# Patient Record
Sex: Female | Born: 1985 | Hispanic: No | Marital: Single | Smoking: Current every day smoker
Health system: Southern US, Community
[De-identification: ages and names within clinical notes are randomized; demographics above are authoritative.]

---
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
## 2011-04-20 ENCOUNTER — Ambulatory Visit: Payer: Self-pay

## 2011-04-20 ENCOUNTER — Other Ambulatory Visit: Payer: Self-pay | Admitting: Occupational Medicine

## 2011-04-20 DIAGNOSIS — R52 Pain, unspecified: Secondary | ICD-10-CM

## 2013-03-19 ENCOUNTER — Emergency Department (HOSPITAL_COMMUNITY)
Admission: EM | Admit: 2013-03-19 | Discharge: 2013-03-19 | Disposition: A | Discharge status: Home / Self Care | Payer: Self-pay | Attending: Emergency Medicine | Admitting: Emergency Medicine

## 2013-03-19 ENCOUNTER — Encounter (HOSPITAL_COMMUNITY): Payer: Self-pay | Admitting: *Deleted

## 2013-03-19 DIAGNOSIS — Z79899 Other long term (current) drug therapy: Secondary | ICD-10-CM | POA: Insufficient documentation

## 2013-03-19 DIAGNOSIS — F17200 Nicotine dependence, unspecified, uncomplicated: Secondary | ICD-10-CM | POA: Insufficient documentation

## 2013-03-19 DIAGNOSIS — Y92410 Unspecified street and highway as the place of occurrence of the external cause: Secondary | ICD-10-CM | POA: Insufficient documentation

## 2013-03-19 DIAGNOSIS — M25569 Pain in unspecified knee: Secondary | ICD-10-CM

## 2013-03-19 DIAGNOSIS — Y9389 Activity, other specified: Secondary | ICD-10-CM | POA: Insufficient documentation

## 2013-03-19 DIAGNOSIS — S8990XA Unspecified injury of unspecified lower leg, initial encounter: Secondary | ICD-10-CM | POA: Insufficient documentation

## 2013-03-19 MED ORDER — IBUPROFEN 200 MG PO TABS
600.0000 mg | ORAL_TABLET | Freq: Once | ORAL | Status: AC
  Administered 2013-03-19: 600 mg via ORAL
  Filled 2013-03-19: qty 3
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
## 2013-04-30 ENCOUNTER — Emergency Department (INDEPENDENT_AMBULATORY_CARE_PROVIDER_SITE_OTHER)
Admission: EM | Admit: 2013-04-30 | Discharge: 2013-04-30 | Disposition: A | Discharge status: Home / Self Care | Payer: Self-pay | Source: Home / Self Care

## 2013-04-30 ENCOUNTER — Encounter (HOSPITAL_COMMUNITY): Payer: Self-pay | Admitting: Emergency Medicine

## 2013-04-30 DIAGNOSIS — R21 Rash and other nonspecific skin eruption: Secondary | ICD-10-CM

## 2013-04-30 MED ORDER — PREDNISONE (PAK) 10 MG PO TABS: ORAL_TABLET | Freq: Every day | ORAL | Status: DC

## 2013-04-30 NOTE — ED Provider Notes (Signed)
CSN: [id number]     Arrival date & time 04/30/13  1308 History   None    Chief Complaint  Patient presents with  . Rash   (Consider location/radiation/quality/duration/timing/severity/associated sxs/prior Treatment) HPI Comments: Very pleasant 27 yo black female presents with a mildly pruritic rash x 1 week. No known exposures. No prior history. No fatigue, fever, chills, weight loss or viral symptoms. Located on the trunk only. Using OTC cortisone cream which does help some.  Patient is a 27 y.o. female presenting with rash. The history is provided by the patient.  Rash   History reviewed. No pertinent past medical history. Past Surgical History  Procedure Laterality Date  . Anterior cruciate ligament repair  2007   No family history on file. History  Substance Use Topics  . Smoking status: Current Every Day Smoker -- 1.00 packs/day    Types: Cigarettes  . Smokeless tobacco: Not on file  . Alcohol Use: Not on file   OB History   Grav Para Term Preterm Abortions TAB SAB Ect Mult Living                 Review of Systems  Skin: Positive for rash.  All other systems reviewed and are negative.    Allergies  Eggs or egg-derived products  Home Medications   Current Outpatient Rx  Name  Route  Sig  Dispense  Refill  . naproxen sodium (ANAPROX) 220 MG tablet   Oral   Take 220 mg by mouth 2 (two) times daily as needed (pain).         . predniSONE (STERAPRED UNI-PAK) 10 MG tablet   Oral   Take by mouth daily. 12 day Pred Pack as directed   1 tablet   0    BP 168/81  Pulse 76  Temp(Src) 98.7 F (37.1 C) (Oral)  Resp 18  SpO2 98%  LMP 03/30/2013 Physical Exam  Nursing note and vitals reviewed. Constitutional: She is oriented to person, place, and time. She appears well-developed and well-nourished. No distress.  HENT:  Head: Normocephalic and atraumatic.  Neck: Normal range of motion.  Pulmonary/Chest: Effort normal.  Musculoskeletal: Normal range of  motion.  Neurological: She is alert and oriented to person, place, and time.  Skin: Skin is warm and dry. Rash noted.  Coin shaped scaly lesions noted on the trunk with hyperpigementation  Psychiatric: Her behavior is normal.    ED Course  Procedures (including critical care time) Labs Review Labs Reviewed - No data to display Imaging Review No results found.  EKG Interpretation     Ventricular Rate:    PR Interval:    QRS Duration:   QT Interval:    QTC Calculation:   R Axis:     Text Interpretation:              MDM   1. Rash and nonspecific skin eruption   Appears to be nummular dermatitis or eczema given the coin shaped lesions. Treat acutely with Prednisone 12 day pack. If returns advise Dermatology appt for scraping.     Riki Sheer, PA-C 04/30/13 1051

## 2013-04-30 NOTE — ED Provider Notes (Signed)
Medical screening examination/treatment/procedure(s) were performed by resident physician or non-physician practitioner and as supervising physician I was immediately available for consultation/collaboration.   Barkley Bruns MD.   Linna Hoff, MD 04/30/13 1124

## 2013-04-30 NOTE — ED Notes (Signed)
Onset 1 1/2 week ago.  Patient first noticed rash to abdomen, patient used a cortizone ointment, the abdominal rash resolved, but then spread to other areas of torso.  Areas do itch, sometimes, but not overwhelmingly.

## 2016-04-20 ENCOUNTER — Other Ambulatory Visit (HOSPITAL_COMMUNITY)
Admission: RE | Admit: 2016-04-20 | Discharge: 2016-04-20 | Disposition: A | Discharge status: Home / Self Care | Payer: BLUE CROSS/BLUE SHIELD | Source: Ambulatory Visit | Attending: Family Medicine | Admitting: Family Medicine

## 2016-04-20 ENCOUNTER — Other Ambulatory Visit: Payer: Self-pay | Admitting: Family Medicine

## 2016-04-20 DIAGNOSIS — Z01419 Encounter for gynecological examination (general) (routine) without abnormal findings: Secondary | ICD-10-CM | POA: Diagnosis present

## 2016-04-24 LAB — CYTOLOGY - PAP: DIAGNOSIS: NEGATIVE

## 2017-12-21 DIAGNOSIS — M25561 Pain in right knee: Secondary | ICD-10-CM | POA: Diagnosis not present

## 2018-04-27 ENCOUNTER — Encounter (HOSPITAL_COMMUNITY): Payer: Self-pay | Admitting: Obstetrics and Gynecology

## 2018-04-27 ENCOUNTER — Emergency Department (HOSPITAL_COMMUNITY)
Admission: EM | Admit: 2018-04-27 | Discharge: 2018-04-27 | Disposition: A | Discharge status: Home / Self Care | Payer: No Typology Code available for payment source | Attending: Emergency Medicine | Admitting: Emergency Medicine

## 2018-04-27 ENCOUNTER — Other Ambulatory Visit: Payer: Self-pay

## 2018-04-27 ENCOUNTER — Emergency Department (HOSPITAL_COMMUNITY): Payer: No Typology Code available for payment source

## 2018-04-27 DIAGNOSIS — F17210 Nicotine dependence, cigarettes, uncomplicated: Secondary | ICD-10-CM | POA: Diagnosis not present

## 2018-04-27 DIAGNOSIS — Y998 Other external cause status: Secondary | ICD-10-CM | POA: Diagnosis not present

## 2018-04-27 DIAGNOSIS — Y92410 Unspecified street and highway as the place of occurrence of the external cause: Secondary | ICD-10-CM | POA: Insufficient documentation

## 2018-04-27 DIAGNOSIS — S0990XA Unspecified injury of head, initial encounter: Secondary | ICD-10-CM | POA: Diagnosis present

## 2018-04-27 DIAGNOSIS — Y93I9 Activity, other involving external motion: Secondary | ICD-10-CM | POA: Diagnosis not present

## 2018-04-27 MED ORDER — IBUPROFEN 600 MG PO TABS: 600.0000 mg | ORAL_TABLET | Freq: Four times a day (QID) | ORAL | 0 refills | Status: DC | PRN

## 2018-04-27 MED ORDER — IBUPROFEN 200 MG PO TABS
600.0000 mg | ORAL_TABLET | Freq: Once | ORAL | Status: AC
  Administered 2018-04-27: 600 mg via ORAL
  Filled 2018-04-27: qty 3

## 2018-04-27 MED ORDER — ACETAMINOPHEN 500 MG PO TABS
1000.0000 mg | ORAL_TABLET | Freq: Once | ORAL | Status: AC
  Administered 2018-04-27: 1000 mg via ORAL
  Filled 2018-04-27: qty 2

## 2018-04-27 MED ORDER — ONDANSETRON HCL 4 MG PO TABS: 4.0000 mg | ORAL_TABLET | Freq: Four times a day (QID) | ORAL | 0 refills | Status: AC
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
## 2018-04-27 NOTE — ED Notes (Signed)
RN leaving patient in triage room  At this time as pt reports light sensitivity and headache.

## 2018-04-27 NOTE — Discharge Instructions (Signed)
[place]

## 2019-08-27 ENCOUNTER — Encounter (HOSPITAL_COMMUNITY): Payer: Self-pay

## 2019-08-27 ENCOUNTER — Other Ambulatory Visit: Payer: Self-pay

## 2019-08-27 ENCOUNTER — Emergency Department (HOSPITAL_COMMUNITY)
Admission: EM | Admit: 2019-08-27 | Discharge: 2019-08-27 | Disposition: A | Discharge status: Home / Self Care | Payer: No Typology Code available for payment source | Attending: Emergency Medicine | Admitting: Emergency Medicine

## 2019-08-27 DIAGNOSIS — F17210 Nicotine dependence, cigarettes, uncomplicated: Secondary | ICD-10-CM | POA: Insufficient documentation

## 2019-08-27 DIAGNOSIS — T23041A Burn of unspecified degree of multiple right fingers (nail), including thumb, initial encounter: Secondary | ICD-10-CM | POA: Diagnosis present

## 2019-08-27 DIAGNOSIS — Y9389 Activity, other specified: Secondary | ICD-10-CM | POA: Diagnosis not present

## 2019-08-27 DIAGNOSIS — T23241A Burn of second degree of multiple right fingers (nail), including thumb, initial encounter: Secondary | ICD-10-CM | POA: Diagnosis not present

## 2019-08-27 DIAGNOSIS — Y990 Civilian activity done for income or pay: Secondary | ICD-10-CM | POA: Insufficient documentation

## 2019-08-27 DIAGNOSIS — Y929 Unspecified place or not applicable: Secondary | ICD-10-CM | POA: Diagnosis not present

## 2019-08-27 DIAGNOSIS — Z79899 Other long term (current) drug therapy: Secondary | ICD-10-CM | POA: Insufficient documentation

## 2019-08-27 DIAGNOSIS — X16XXXA Contact with hot heating appliances, radiators and pipes, initial encounter: Secondary | ICD-10-CM | POA: Insufficient documentation

## 2019-08-27 MED ORDER — SILVER SULFADIAZINE 1 % EX CREA
TOPICAL_CREAM | Freq: Once | CUTANEOUS | Status: AC
  Filled 2019-08-27: qty 50

## 2019-08-27 MED ORDER — SILVER SULFADIAZINE 1 % EX CREA: 1.0000 "application " | TOPICAL_CREAM | Freq: Two times a day (BID) | CUTANEOUS | 0 refills | Status: AC

## 2019-08-27 NOTE — ED Provider Notes (Signed)
[facility] DEPT Provider Note   CSN: [id number] Arrival date & time: 08/27/19  2012     History Chief Complaint  Patient presents with  . Hand Burn    Monique Vasquez is a 34 y.o. female.  The history is provided by the patient. No language interpreter was used.   Monique Vasquez is a 34 y.o. female who presents to the Emergency Department complaining of hand burn. She is right-hand dominant and presents to the emergency department for evaluation of right hand burned while she was at work. She works at [employer] and was reaching for a metal spoon it was hot and burned her hand. She complains of pain and numbness to the tip of her right thumb as well as the palm of her hand and across a few digits. She has no medical problems and takes no medications. She states her tetanus shot was in the last 10 years. She denies any chance of pregnancy. Symptoms are moderate and constant nature.    History reviewed. No pertinent past medical history.  There are no problems to display for this patient.   Past Surgical History:  Procedure Laterality Date  . ANTERIOR CRUCIATE LIGAMENT REPAIR  2007     OB History   No obstetric history on file.     No family history on file.  Social History   Tobacco Use  . Smoking status: Current Every Day Smoker    Packs/day: 1.00    Types: Cigarettes  . Smokeless tobacco: Never Used  Substance Use Topics  . Alcohol use: Yes    Comment: Socially  . Drug use: Yes    Types: Marijuana    Home Medications Prior to Admission medications   Medication Sig Start Date End Date Taking? Authorizing Provider  ibuprofen (ADVIL,MOTRIN) 600 MG tablet Take 1 tablet (600 mg total) by mouth every 6 (six) hours as needed. 04/27/18   Valarie Merino, MD  Menthol (ICY HOT BACK) 5 % PTCH Apply 1 patch topically daily as needed.    [provider]  naproxen sodium (ANAPROX) 220 MG tablet Take 660 mg by mouth daily as needed (pain).      [provider]  ondansetron (ZOFRAN) 4 MG tablet Take 1 tablet (4 mg total) by mouth every 6 (six) hours. 04/27/18   Valarie Merino, MD  predniSONE (STERAPRED UNI-PAK) 10 MG tablet Take by mouth daily. 12 day Pred Pack as directed Patient not taking: Reported on 04/27/2018 04/30/13   Bjorn Pippin, PA-C  silver sulfADIAZINE (SILVADENE) 1 % cream Apply 1 application topically 2 (two) times daily. Apply to affected area 08/27/19   Quintella Reichert, MD    Allergies    Eggs or egg-derived products  Review of Systems   Review of Systems  All other systems reviewed and are negative.   Physical Exam Updated Vital Signs BP (!) 129/92 (BP Location: Left Arm)   Pulse 71   Temp 98.5 F (36.9 C) (Oral)   Resp 17   Ht 5\' 4"  (1.626 m)   Wt 100.7 kg   SpO2 99%   BMI 38.11 kg/m   Physical Exam Vitals and nursing note reviewed.  Constitutional:      Appearance: She is well-developed.  HENT:     Head: Normocephalic and atraumatic.  Cardiovascular:     Rate and Rhythm: Normal rate.  Pulmonary:     Effort: Pulmonary effort is normal. No respiratory distress.  Musculoskeletal:  General: No tenderness.     Comments: 2+ radial pulses bilaterally. There is a linear burn to the palmar surface of the right hand that is partial thickness. There is also a small partial thickness burns on the palmar surface of the right second digit. There is a partial to full thickness burns about the size of a dime to the tip of the right thumb. There is no significant edema of the digits. Range of motion is intact throughout the hand. Flexion extension is intact throughout all digits against resistance. No circumferential burn  Skin:    General: Skin is warm and dry.  Neurological:     Mental Status: She is alert and oriented to person, place, and time.     Comments: 5/5 grip strength bilaterally.  AIN, PIN intact  Psychiatric:        Behavior: Behavior normal.     ED Results /  Procedures / Treatments   Labs (all labs ordered are listed, but only abnormal results are displayed) Labs Reviewed - No data to display  EKG None  Radiology No results found.  Procedures Procedures (including critical care time)  Medications Ordered in ED Medications  silver sulfADIAZINE (SILVADENE) 1 % cream (has no administration in time range)    ED Course  I have reviewed the triage vital signs and the nursing notes.  Pertinent labs & imaging results that were available during my care of the patient were reviewed by me and considered in my medical decision making (see chart for details).    MDM Rules/Calculators/A&P                     patient here for evaluation of right hand pain following a burn. She has partial thickness burns to the palmar hand with skin intact. There is a possible very small area of full thickness burns to the tip of the left thumb. She is well perfused on examination with no circumferential burns. Discussed with patient home care for hand burn. Discussed recommendation for elevation cool compresses, NSAIDs. Discussed hand follow-up and return precautions.  Final Clinical Impression(s) / ED Diagnoses Final diagnoses:  Partial thickness burn of multiple digits of right hand including partial thickness burn of thumb, initial encounter    Rx / DC Orders ED Discharge Orders         Ordered    silver sulfADIAZINE (SILVADENE) 1 % cream  2 times daily     08/27/19 2107           Tilden Fossa, MD 08/27/19 2112
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
## 2019-08-27 NOTE — Discharge Instructions (Signed)
Be sure to keep your hand elevated above your heart. You may apply ice packs for comfort. You may take ibuprofen, available over-the-counter according to label instructions for pain.
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
## 2019-09-06 ENCOUNTER — Encounter: Payer: Self-pay | Admitting: Internal Medicine

## 2019-09-06 ENCOUNTER — Ambulatory Visit (INDEPENDENT_AMBULATORY_CARE_PROVIDER_SITE_OTHER): Payer: No Typology Code available for payment source | Admitting: Internal Medicine

## 2019-09-06 ENCOUNTER — Other Ambulatory Visit: Payer: Self-pay

## 2019-09-06 VITALS — BP 129/89 | HR 73 | Temp 98.1°F | Ht 64.0 in | Wt 220.0 lb

## 2019-09-06 DIAGNOSIS — T23241A Burn of second degree of multiple right fingers (nail), including thumb, initial encounter: Secondary | ICD-10-CM

## 2019-09-06 NOTE — Patient Instructions (Addendum)
Ms. Elms,  It was a pleasure meeting you today.  You can continue to use Silvadene on the wound daily with dressing changes.  Afterwards you can use xeroform or vaseline daily with dressing changes.  Please follow up in 2 weeks.  Please call the clinic with any questions or concerns in the meantime.

## 2019-09-06 NOTE — Progress Notes (Signed)
   Subjective:     Patient ID: Monique Vasquez, female    DOB: February 26, 1986, 34 y.o.   MRN: [id number]  Chief Complaint  Patient presents with  . Advice Only    for burn on hand    HPI: The patient is a 34 y.o. female here for right hand burn  The patient states that 10 days ago she burned her hand at work when picking up a hot spoon.  She visited the ED and was started on Silvadene.  She reports using it every day with dressing changes.  She denies any drainage to the wound.  She states she did not have any superficial blisters following the incident.  She reports discomfort when using her thumb to pick up objects.    Review of Systems  All other systems reviewed and are negative.    has no past medical history on file.  has a past surgical history that includes Anterior cruciate ligament repair (2007).  reports that she has been smoking cigarettes. She has been smoking about 1.00 pack per day. She has never used smokeless tobacco. Objective:   Vital Signs BP 129/89 (BP Location: Left Arm, Patient Position: Sitting, Cuff Size: Large)   Pulse 73   Temp 98.1 F (36.7 C) (Temporal)   Ht 5\' 4"  (1.626 m)   Wt 220 lb (99.8 kg)   SpO2 100%   BMI 37.76 kg/m  Vital Signs and Nursing Note Reviewed Physical Exam  Skin: Skin is warm and dry.  Burn thumb wound measures: 1.5 x 1.0 cm     Assessment/Plan:     ICD-10-CM   1. Partial thickness burn of multiple digits of right hand including partial thickness burn of thumb, initial encounter  T23.241A     Assessment: Partial-thickness burn of the right hand There are no open wounds to the right hand. The palm and index finger has a dried scab. The right thumb has a hardened area at the distal point.  There is likely a blister underneath that will eventually rupture.  At this time I recommended using Vaseline or xerofrom on the hand with daily dressing changes.  She has full range of motion of her hand and fingers.  She states she still has  discomfort with her thumb when picking up items.  I will have her follow-up in 2 weeks to reassess the thumb.  Plan - xeroform or vaseline with daily dressing changes - In office xeroform dressing change -follow up in 2 weeks  , DO 09/06/2019, 11:49 AM

## 2019-09-19 DIAGNOSIS — Z13228 Encounter for screening for other metabolic disorders: Secondary | ICD-10-CM | POA: Diagnosis not present

## 2019-09-19 DIAGNOSIS — Z13220 Encounter for screening for lipoid disorders: Secondary | ICD-10-CM | POA: Diagnosis not present

## 2019-09-19 DIAGNOSIS — Z113 Encounter for screening for infections with a predominantly sexual mode of transmission: Secondary | ICD-10-CM | POA: Diagnosis not present

## 2019-09-19 DIAGNOSIS — Z01419 Encounter for gynecological examination (general) (routine) without abnormal findings: Secondary | ICD-10-CM | POA: Diagnosis not present

## 2019-09-19 DIAGNOSIS — E559 Vitamin D deficiency, unspecified: Secondary | ICD-10-CM | POA: Diagnosis not present

## 2019-09-19 DIAGNOSIS — N898 Other specified noninflammatory disorders of vagina: Secondary | ICD-10-CM | POA: Diagnosis not present

## 2019-09-19 DIAGNOSIS — Z131 Encounter for screening for diabetes mellitus: Secondary | ICD-10-CM | POA: Diagnosis not present

## 2019-09-20 ENCOUNTER — Encounter: Payer: Self-pay | Admitting: Internal Medicine

## 2019-09-20 ENCOUNTER — Ambulatory Visit (INDEPENDENT_AMBULATORY_CARE_PROVIDER_SITE_OTHER): Payer: No Typology Code available for payment source | Admitting: Internal Medicine

## 2019-09-20 ENCOUNTER — Other Ambulatory Visit: Payer: Self-pay

## 2019-09-20 VITALS — BP 111/79 | HR 98 | Temp 98.2°F | Ht 64.0 in | Wt 220.0 lb

## 2019-09-20 DIAGNOSIS — T23249D Burn of second degree of unspecified multiple fingers (nail), including thumb, subsequent encounter: Secondary | ICD-10-CM | POA: Diagnosis not present

## 2019-09-20 NOTE — Patient Instructions (Signed)
Monique Vasquez,  It was a pleasure seeing you today.  Please use xeroform or vaseline on the thumb.  You are cleared to return to work on 4/13.  Please follow up with [place] as needed  Please call with any questions or concerns

## 2019-09-20 NOTE — Progress Notes (Signed)
   Subjective:     Patient ID: Monique Vasquez, female    DOB: [birth year], 34 y.o.   MRN: [id number]  Chief Complaint  Patient presents with  . Follow-up    follow up 09/06/19 partial thickness of multiple digigs of right hand including thumb    HPI: The patient is a 34 y.o. female here for follow-up right hand burn  She has been using silvadene on the wound daily with coban wrap.  She reports minimal discomfort to the thumb.  Overall feels well with no complaints today.  Review of Systems  All other systems reviewed and are negative.    has no past medical history on file.  has a past surgical history that includes Anterior cruciate ligament repair (2007).  reports that she has been smoking cigarettes. She has been smoking about 1.00 pack per day. She has never used smokeless tobacco. Objective:   Vital Signs BP 111/79 (BP Location: Left Arm, Patient Position: Sitting, Cuff Size: Large)   Pulse 98   Temp 98.2 F (36.8 C) (Temporal)   Ht 5\' 4"  (1.626 m)   Wt 220 lb (99.8 kg)   SpO2 96%   BMI 37.76 kg/m  Vital Signs and Nursing Note Reviewed Physical Exam  Constitutional: She is well-developed, well-nourished, and in no distress.  Cardiovascular: Intact distal pulses.  Musculoskeletal:     Comments: 5/5 grip strength to right hand Sensation intact to the right hand  Skin: Skin is warm and dry.      Assessment/Plan:     ICD-10-CM   1. Partial thickness burn of multiple digits of hand including partial thickness burn of thumb, subsequent encounter  T23.249D     Assessment: Partial-thickness burn of the right hand It has been over 3 weeks since the original burn incident.  Last visit she had a hardened area at the distal point of her right thumb that has softened some.  I encouraged she use vaseline or xeroform on the site with coban wrap to continue to soften the area.  She has sensation and no restriction with the right hand.  Overall she is doing well and can return to  work at .  Plan -vaseline or xerofrom on the thumb with coban wrap -follow up as needed -can return to work   [employer], DO 09/20/2019, 10:17 AM

## 2019-12-21 ENCOUNTER — Telehealth: Payer: Self-pay | Admitting: Internal Medicine

## 2019-12-21 NOTE — Telephone Encounter (Signed)
Rodman Pickle left vm requesting last office note from 09/20/19. Faxed note to [phone]; received success confirmation.

## 2020-01-25 DIAGNOSIS — S83411A Sprain of medial collateral ligament of right knee, initial encounter: Secondary | ICD-10-CM | POA: Diagnosis not present

## 2020-02-15 ENCOUNTER — Telehealth: Payer: Self-pay | Admitting: *Deleted

## 2020-02-15 NOTE — Telephone Encounter (Signed)
Filled out Form 25R and faxed it back on (02/14/20) [id number] [place] .  Confirmation received and copy scanned into the chart.//AB/CMA

## 2020-02-17 DIAGNOSIS — Z20822 Contact with and (suspected) exposure to covid-19: Secondary | ICD-10-CM | POA: Diagnosis not present

## 2020-05-22 DIAGNOSIS — R432 Parageusia: Secondary | ICD-10-CM | POA: Diagnosis not present

## 2020-05-22 DIAGNOSIS — U071 COVID-19: Secondary | ICD-10-CM | POA: Diagnosis not present

## 2020-05-22 DIAGNOSIS — R430 Anosmia: Secondary | ICD-10-CM | POA: Diagnosis not present

## 2021-10-06 ENCOUNTER — Ambulatory Visit (INDEPENDENT_AMBULATORY_CARE_PROVIDER_SITE_OTHER): Payer: BC Managed Care – PPO

## 2021-10-06 ENCOUNTER — Other Ambulatory Visit: Payer: Self-pay

## 2021-10-06 ENCOUNTER — Ambulatory Visit (INDEPENDENT_AMBULATORY_CARE_PROVIDER_SITE_OTHER): Payer: BC Managed Care – PPO | Admitting: Orthopaedic Surgery

## 2021-10-06 DIAGNOSIS — Z9889 Other specified postprocedural states: Secondary | ICD-10-CM

## 2021-10-06 DIAGNOSIS — M1711 Unilateral primary osteoarthritis, right knee: Secondary | ICD-10-CM

## 2021-10-06 DIAGNOSIS — G8929 Other chronic pain: Secondary | ICD-10-CM

## 2021-10-06 DIAGNOSIS — M25561 Pain in right knee: Secondary | ICD-10-CM

## 2021-10-06 NOTE — Progress Notes (Signed)
The patient is a 36 year old female who comes in today with chronic knee pain with locking and catching.  In 2007 she had a right knee ACL reconstruction done elsewhere.  Her knee had done well for many years.  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  It is hurt since then.  She was concerned that there was something torn within her right knee.  She has even seen the previous orthopedic surgeon who recommended therapy which was an appropriate recommendation I believe at the time.  She comes today for second opinion still concerned that there is something going on her knee because she develops locking catching and instability with the right knee. ? ?On exam both knees slightly hyperextend.  I do feel stable endpoints of the right knee but she is uncomfortable when I put her through a Lachman's exam.  Her range of motion is entirely full.  There is a little bit of posterior lateral pain when I put her through McMurray's exam and with rotation of the tibia on the femur.  There is slight laxity in that right knee when comparing the right and left knees but is only slight.  There is no knee joint effusion. ? ?A standing AP and lateral of the right knee shows no acute findings.  The bone tunnels appear in good position with no widening of the tunnels and the knee looks good overall. ? ?At this point I do feel a MRI is warranted of her right knee given that she continues to have symptoms of locking catching and instability symptoms of that right knee now 2 years after her injury.  She is concerned about the integrity of her ACL reconstruction and I feel that there is certainly needs an MRI to assess the ACL as well as look for meniscal tear.  We will see her back once we have that study. ?

## 2021-10-15 ENCOUNTER — Ambulatory Visit
Admission: RE | Admit: 2021-10-15 | Discharge: 2021-10-15 | Disposition: A | Discharge status: Home / Self Care | Payer: BC Managed Care – PPO | Source: Ambulatory Visit | Attending: Orthopaedic Surgery | Admitting: Orthopaedic Surgery

## 2021-10-15 DIAGNOSIS — M25461 Effusion, right knee: Secondary | ICD-10-CM | POA: Diagnosis not present

## 2021-10-15 DIAGNOSIS — R600 Localized edema: Secondary | ICD-10-CM | POA: Diagnosis not present

## 2021-10-15 DIAGNOSIS — M1711 Unilateral primary osteoarthritis, right knee: Secondary | ICD-10-CM | POA: Diagnosis not present

## 2021-10-15 DIAGNOSIS — Z9889 Other specified postprocedural states: Secondary | ICD-10-CM | POA: Diagnosis not present

## 2021-11-04 ENCOUNTER — Ambulatory Visit (INDEPENDENT_AMBULATORY_CARE_PROVIDER_SITE_OTHER): Payer: BC Managed Care – PPO | Admitting: Orthopaedic Surgery

## 2021-11-04 ENCOUNTER — Encounter: Payer: Self-pay | Admitting: Orthopaedic Surgery

## 2021-11-04 DIAGNOSIS — M1711 Unilateral primary osteoarthritis, right knee: Secondary | ICD-10-CM

## 2021-11-04 MED ORDER — CELECOXIB 200 MG PO CAPS: 200.0000 mg | ORAL_CAPSULE | Freq: Every day | ORAL | 1 refills | Status: AC

## 2021-11-04 MED ORDER — LIDOCAINE HCL 1 % IJ SOLN
3.0000 mL | INTRAMUSCULAR | Status: AC | PRN
  Administered 2021-11-04: 3 mL

## 2021-11-04 MED ORDER — METHYLPREDNISOLONE ACETATE 40 MG/ML IJ SUSP
40.0000 mg | INTRAMUSCULAR | Status: AC | PRN
  Administered 2021-11-04: 40 mg via INTRA_ARTICULAR

## 2021-11-04 NOTE — Progress Notes (Signed)
Office Visit Note   Patient: Monique Vasquez           Date of Birth: 04/02/1986           MRN: [id number] Visit Date: 11/04/2021              Requested by: No referring provider defined for this encounter. PCP: Patient, No Pcp Per (Inactive)   Assessment & Plan: Visit Diagnoses:  1. Primary osteoarthritis of right knee     Plan:  Recommend knee strengthening.  Also due to the fact that she has failed at least 2 NSAIDs recommend Celebrex 200 mg once daily.  We will send her to formal physical therapy for quad strengthening discussed knee friendly exercises with her.  Discussed weight loss.  Follow-up with [place] in 6 weeks see how she responded to the cortisone injection and Celebrex.  Questions were encouraged and answered by Dr. Magnus Ivan myself.  Follow-Up Instructions: Return in about 6 weeks (around 12/16/2021).   Orders:  Orders Placed This Encounter  Procedures   Large Joint Inj: R knee   Meds ordered this encounter  Medications   celecoxib (CELEBREX) 200 MG capsule    Sig: Take 1 capsule (200 mg total) by mouth daily.    Dispense:  30 capsule    Refill:  1      Procedures: Large Joint Inj: R knee on 11/04/2021 10:39 AM Indications: pain Details: 22 G 1.5 in needle, anterolateral approach  Arthrogram: No  Medications: 3 mL lidocaine 1 %; 40 mg methylPREDNISolone acetate 40 MG/ML Outcome: tolerated well, no immediate complications Procedure, treatment alternatives, risks and benefits explained, specific risks discussed. Consent was given by the patient. Immediately prior to procedure a time out was called to verify the correct patient, procedure, equipment, support staff and site/side marked as required. Patient was prepped and draped in the usual sterile fashion.      Clinical Data: No additional findings.   Subjective: Chief Complaint  Patient presents with   Right Knee - Pain, Follow-up    HPI Nice returns today to go over the MRI of her right knee.  There  is been no change in overall pain in the knee.  She does state that taking ibuprofen and naproxen both are upsetting her stomach she is asking for a different anti-inflammatory if possible.  She denies any fevers chills.  She is nondiabetic. MRI images reviewed with patient.  No meniscal tear.  ACL reconstruction graft is intact.  Tricompartmental arthritic changes with moderate patellofemoral cartilage loss and full-thickness cartilage loss in the posterior aspect of the lateral tibial plateau with high-grade subchondral marrow edema and cystic changes..  Mild cartilage thinning weightbearing portion medial femoral condyle.  No effusion.  Review of Systems See HPI  Objective: Vital Signs: There were no vitals taken for this visit.  Physical Exam General well-developed well-nourished female no acute distress.  Affect appropriate no assistive device use to ambulate Ortho Exam Right knee: No abnormal warmth erythema or effusion.  Good range of motion.  Patellofemoral crepitus with passive range of motion. Specialty Comments:  No specialty comments available.  Imaging: No results found.   PMFS History: There are no problems to display for this patient.  History reviewed. No pertinent past medical history.  History reviewed. No pertinent family history.  Past Surgical History:  Procedure Laterality Date   ANTERIOR CRUCIATE LIGAMENT REPAIR  2007   Social History   Occupational History   Not on file  Tobacco Use  Smoking status: Every Day    Packs/day: 1.00    Types: Cigarettes   Smokeless tobacco: Never  Vaping Use   Vaping Use: Never used  Substance and Sexual Activity   Alcohol use: Yes    Comment: Socially   Drug use: Yes    Types: Marijuana   Sexual activity: Not on file

## 2021-11-04 NOTE — Addendum Note (Signed)
Addended by: Robyne Peers on: 11/04/2021 04:48 PM   Modules accepted: Orders

## 2022-06-12 DIAGNOSIS — M65341 Trigger finger, right ring finger: Secondary | ICD-10-CM | POA: Diagnosis not present

## 2022-10-24 IMAGING — MR MR KNEE*R* W/O CM
6 of 7 series · 33 of 40 positions shown · non-contrast
Comparison: Right knee radiographs 10/06/2021

CLINICAL DATA: Status post ACL reconstruction. Evaluate for
meniscal tear. Dropped couch on knee 2 years ago. History of ACL
surgery in 3441.

EXAM:
MRI OF THE RIGHT KNEE WITHOUT CONTRAST
TECHNIQUE: Multiplanar, multisequence MR imaging of the knee was performed. No
intravenous contrast was administered.

[Series 3: T2 fat-sat · axial · right · 4.0mm · 0.42mm/px · z∈[-5,+135]mm · 6 of 29 slices shown (1 of 3)]
[im 1/29]
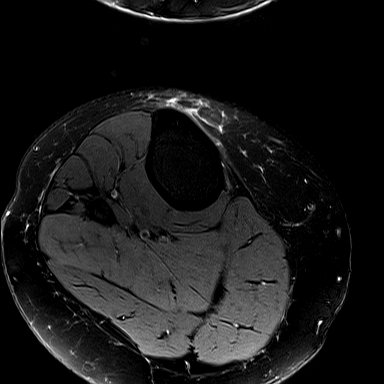
[im 6/29]
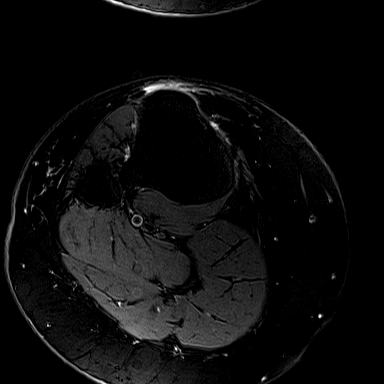
[im 12/29]
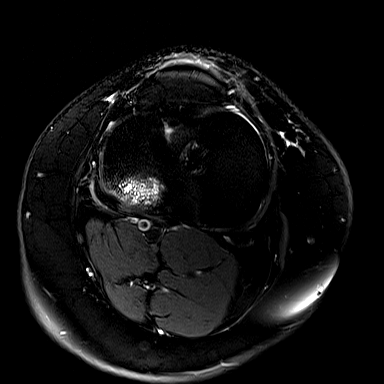
[im 17/29]
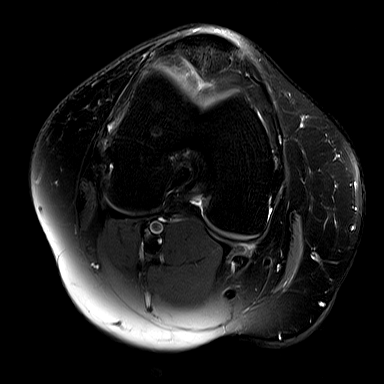
[im 23/29]
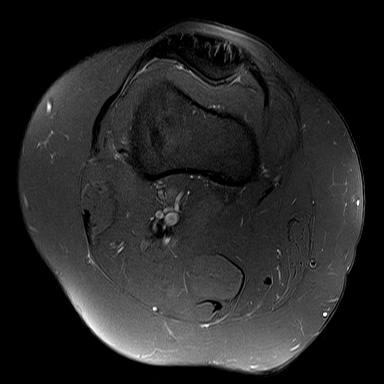
[im 29/29]
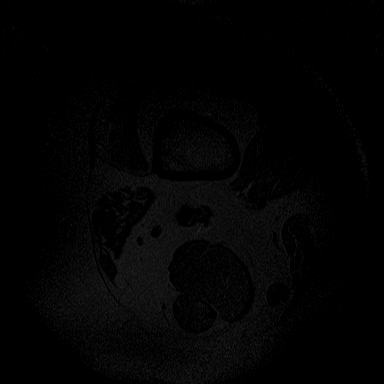

[Series 5: T2 fat-sat · coronal · right · 4.0mm · 0.50mm/px · 6 of 27 slices shown (2 of 3)]
[im 1/27]
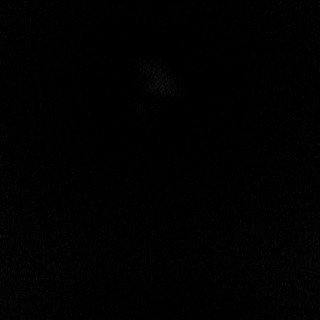
[im 6/27]
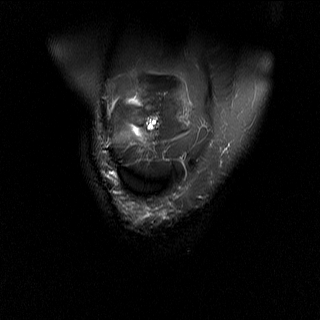
[im 11/27]
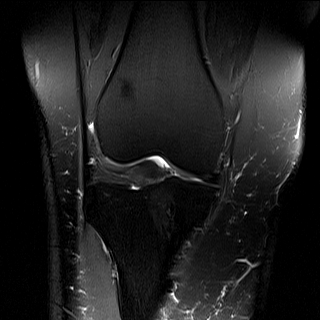
[im 16/27]
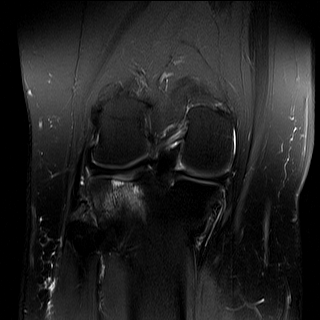
[im 21/27]
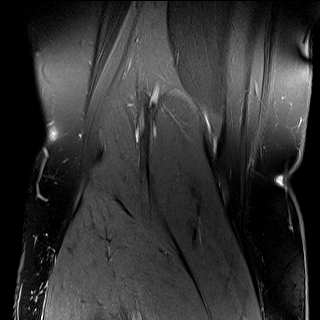
[im 27/27]
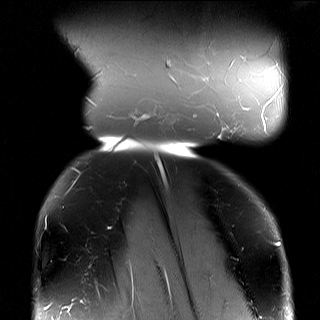

[Series 6: PD fat-sat · coronal · right · 3.0mm · 0.50mm/px · 7 of 32 slices shown (1 of 2)]
[im 1/32]
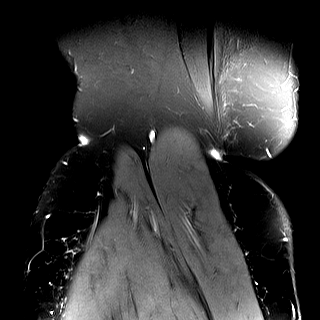
[im 6/32]
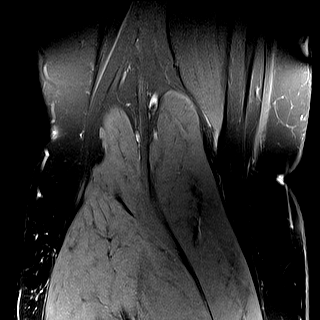
[im 11/32]
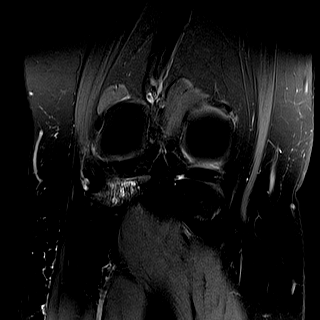
[im 16/32]
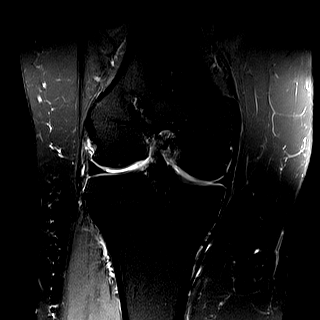
[im 21/32]
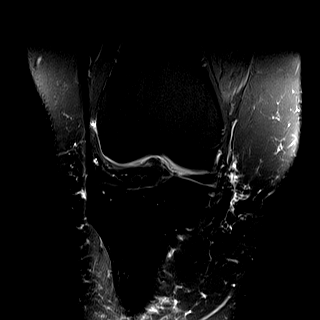
[im 26/32]
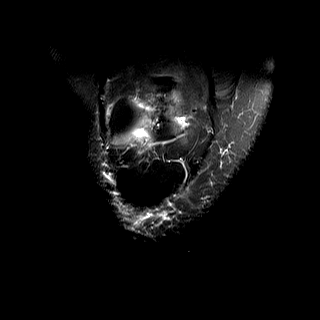
[im 32/32]
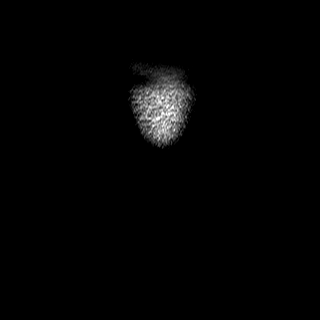

[Series 7: PD fat-sat · sagittal · right · 3.0mm · 0.47mm/px · 5 of 26 slices shown (2 of 2)]
[im 1/26]
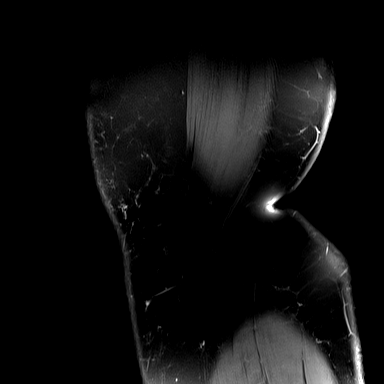
[im 7/26]
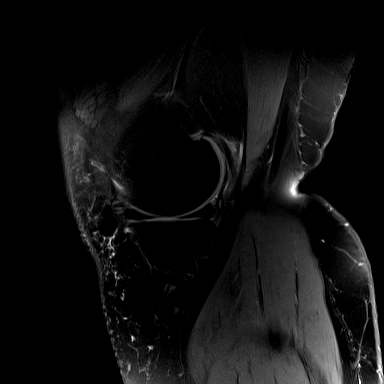
[im 13/26]
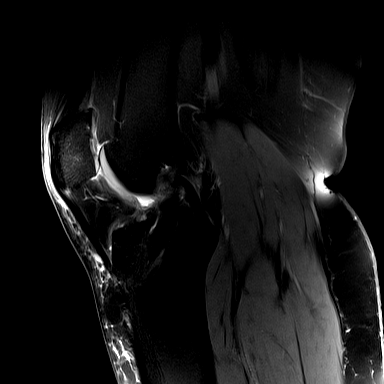
[im 19/26]
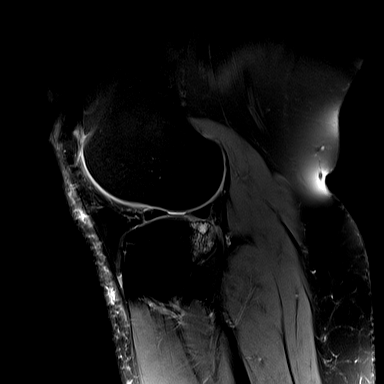
[im 26/26]
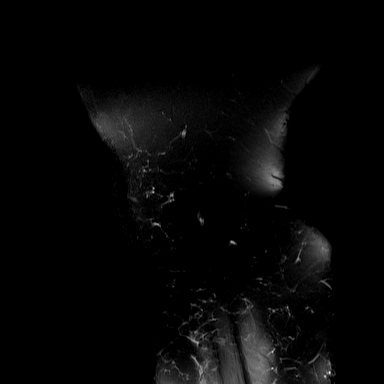

[Series 8: T2 fat-sat · sagittal · right · 3.0mm · 0.35mm/px · 4 of 26 slices shown (3 of 3)]
[im 1/26]
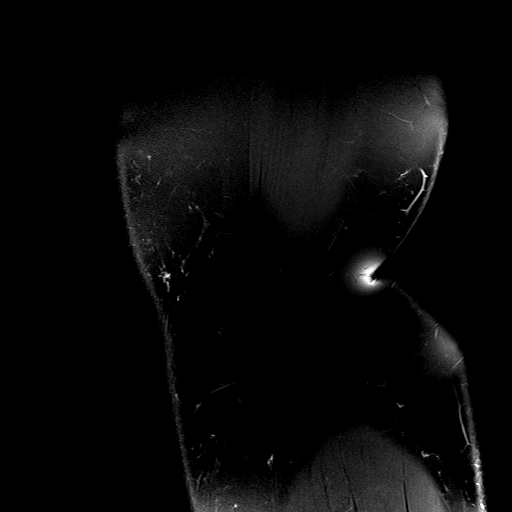
[im 7/26]
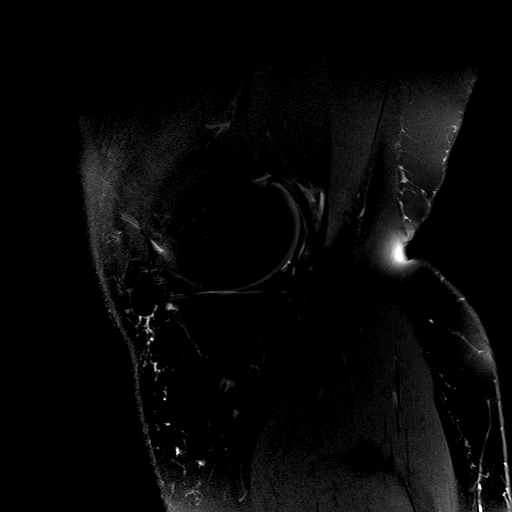
[im 13/26]
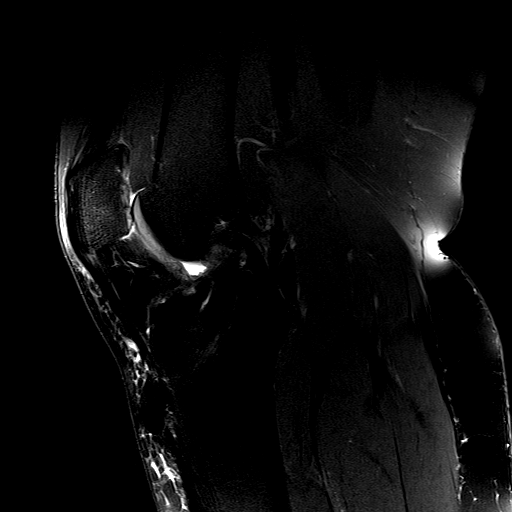
[im 19/26]
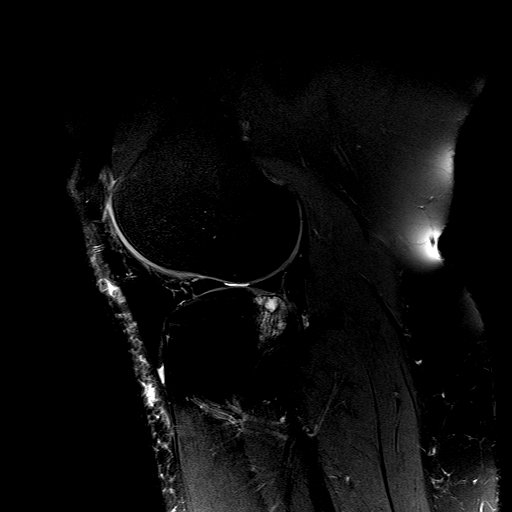

[Series 9: PD · coronal · right · 1.5mm · 0.44mm/px · 5 of 23 slices shown]
[im 1/23]
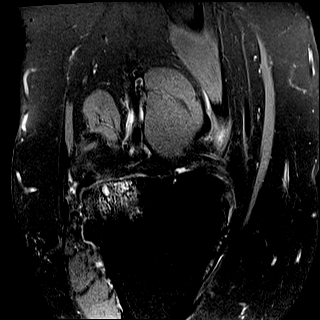
[im 6/23]
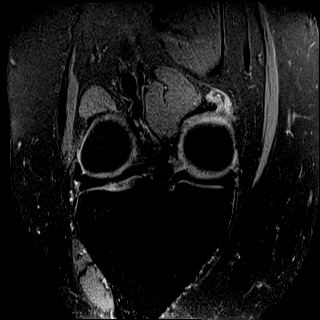
[im 12/23]
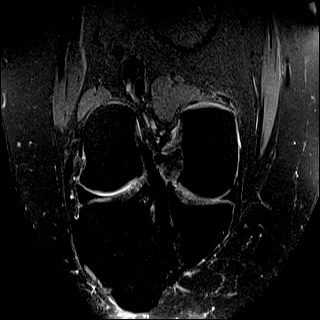
[im 17/23]
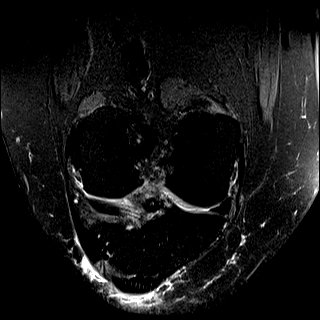
[im 23/23]
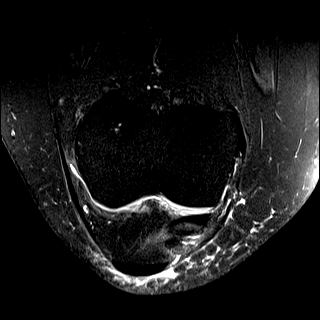

[33 of 40 positions shown; findings below may reference images not displayed]

FINDINGS: MENISCI

Medial meniscus: Mild intermediate proton density signal
intrasubstance degeneration within the posterior horn of the medial
meniscus. No tear is seen extending through the articular surface of
the medial meniscus.

Lateral meniscus: Mild intermediate proton density signal
intrasubstance degeneration within the posterior horn of the lateral
meniscus. No tear is seen extending through the articular surface of
the lateral meniscus.

LIGAMENTS

Cruciates: Postsurgical changes of prior ACL reconstruction. The
graft fibers are parallel and intact. The PCL is intact.

Collaterals: The medial collateral ligament is intact. The fibular
collateral ligament, biceps femoris tendon, iliotibial band, and
popliteus tendon are intact.

CARTILAGE

Patellofemoral: Moderate to high-grade partial-thickness thinning of
the lateral patellar facet cartilage, greatest inferiorly. Moderate
subchondral cystic change at the inferolateral patellar facet near
the patellar apex. Mild thinning of the medial patellar facet
cartilage at mid height. Mild-to-moderate thinning of the medial
trochlear cartilage.

Medial: Mild thinning of the weight-bearing medial femoral condyle
cartilage.

Lateral: Moderate thinning of the weight-bearing lateral femoral
condyle cartilage. Full-thickness cartilage loss within the far
posterior measuring up to 11 mm in AP dimension and 15 mm in
transverse dimension. There is high-grade focal subchondral marrow
edema and cystic change in this region.

Joint: Nojoint effusion. Mild edema within the superolateral aspect
of Hoffa's fat pad.

Popliteal Fossa:  No Baker's cyst.

Extensor Mechanism:  Intact quadriceps tendon and patellar tendon.

Bones:  No acute fracture or dislocation.

Other: None.
IMPRESSION: :
IMPRESSION: 1. Status post ACL reconstruction with intact graft.
2. No meniscal tear is seen.
3. Moderate patellofemoral compartment cartilage degenerative
changes. Full-thickness cartilage loss within the posterior aspect
of the lateral tibial plateau measuring 15 x 11 mm, with high-grade
subchondral marrow edema and cystic change.

## 2024-05-01 ENCOUNTER — Encounter: Payer: Self-pay | Admitting: Emergency Medicine

## 2024-05-01 ENCOUNTER — Ambulatory Visit
Admission: EM | Admit: 2024-05-01 | Discharge: 2024-05-01 | Disposition: A | Discharge status: Home / Self Care | Attending: Emergency Medicine | Admitting: Emergency Medicine

## 2024-05-01 DIAGNOSIS — H60392 Other infective otitis externa, left ear: Secondary | ICD-10-CM | POA: Diagnosis not present

## 2024-05-01 MED ORDER — CIPROFLOXACIN-DEXAMETHASONE 0.3-0.1 % OT SUSP: 4.0000 [drp] | Freq: Two times a day (BID) | OTIC | 0 refills | Status: AC

## 2024-05-01 NOTE — Discharge Instructions (Signed)
 Today you are being treated for an infection of the ear canal on the left side, able to see drainage within the tears however the right side of the ear is blocked with wax, may use over-the-counter Debrox drops to make it easier to clean out  Place 4 drops of Ciprodex into the ear every morning and every evening for 7 days, this is a mixture of antibiotic and steroid  You may use Tylenol  or ibuprofen  for management of discomfort  May hold warm compresses to the ear for additional comfort  Please not attempted any ear cleaning or object or fluid placement into the ear canal to prevent further irritation   Please follow-up for reevaluation if your symptoms continue to persist

## 2024-05-01 NOTE — ED Triage Notes (Signed)
 Patient reports left ear being clogged x  2 weeks. Patient states she had been doing a lot of flying lately. Denies pain. Patient has used ear drops with no relief.

## 2024-05-01 NOTE — ED Provider Notes (Signed)
 [facility]    CSN: [id number] Arrival date & time: 05/01/24  1302      History   Chief Complaint Chief Complaint  Patient presents with   Ear Fullness    HPI Monique Vasquez is a 38 y.o. female.   Patient presents for evaluation of left-sided ear fullness present for 2 weeks.  Has also experienced a slight cough but denies presence of nasal congestion or fever. No known sick contact but recent travel. Has attempted otc ear drops which have been ineffective. Denies changes in hearing, ear drainage or pain.     History reviewed. No pertinent past medical history.  There are no active problems to display for this patient.   Past Surgical History:  Procedure Laterality Date   ANTERIOR CRUCIATE LIGAMENT REPAIR  2007    OB History   No obstetric history on file.      Home Medications    Prior to Admission medications   Medication Sig Start Date End Date Taking? Authorizing Provider  celecoxib  (CELEBREX ) 200 MG capsule Take 1 capsule (200 mg total) by mouth daily. 11/04/21   Gretta Bertrum ORN, PA-C  Menthol (ICY HOT BACK) 5 % PTCH Apply 1 patch topically daily as needed.    [provider]  ondansetron  (ZOFRAN ) 4 MG tablet Take 1 tablet (4 mg total) by mouth every 6 (six) hours. 04/27/18   Laurice Maude BROCKS, MD  silver  sulfADIAZINE  (SILVADENE ) 1 % cream Apply 1 application topically 2 (two) times daily. Apply to affected area 08/27/19   Griselda Norris, MD    Family History History reviewed. No pertinent family history.  Social History Social History   Tobacco Use   Smoking status: Every Day    Current packs/day: 1.00    Types: Cigarettes   Smokeless tobacco: Never  Vaping Use   Vaping status: Never Used  Substance Use Topics   Alcohol use: Yes    Comment: Socially   Drug use: Yes    Types: Marijuana     Allergies   Egg protein-containing drug products   Review of Systems Review of Systems   Physical Exam Triage Vital Signs ED  Triage Vitals  Encounter Vitals Group     BP 05/01/24 1308 131/85     Girls Systolic BP Percentile --      Girls Diastolic BP Percentile --      Boys Systolic BP Percentile --      Boys Diastolic BP Percentile --      Pulse Rate 05/01/24 1308 76     Resp 05/01/24 1308 20     Temp 05/01/24 1308 98.6 F (37 C)     Temp Source 05/01/24 1308 Oral     SpO2 05/01/24 1308 98 %     Weight --      Height --      Head Circumference --      Peak Flow --      Pain Score 05/01/24 1310 0     Pain Loc --      Pain Education --      Exclude from Growth Chart --    No data found.  Updated Vital Signs BP 131/85 (BP Location: Left Arm)   Pulse 76   Temp 98.6 F (37 C) (Oral)   Resp 20   LMP 04/25/2024 (Exact Date)   SpO2 98%   Visual Acuity Right Eye Distance:   Left Eye Distance:   Bilateral Distance:    Right Eye Near:  Left Eye Near:    Bilateral Near:     Physical Exam Constitutional:      Appearance: Normal appearance.  HENT:     Right Ear: There is impacted cerumen.     Ears:     Comments: Abril Cappiello to pale yellow drainage within the ear canal, no erythema or swelling present, no abnormality to the tympanic membrane  Eyes:     Extraocular Movements: Extraocular movements intact.  Pulmonary:     Effort: Pulmonary effort is normal.  Neurological:     Mental Status: She is alert and oriented to person, place, and time. Mental status is at baseline.      UC Treatments / Results  Labs (all labs ordered are listed, but only abnormal results are displayed) Labs Reviewed - No data to display  EKG   Radiology No results found.  Procedures Procedures (including critical care time)  Medications Ordered in UC Medications - No data to display  Initial Impression / Assessment and Plan / UC Course  I have reviewed the triage vital signs and the nursing notes.  Pertinent labs & imaging results that were available during my care of the patient were reviewed by me and  considered in my medical decision making (see chart for details).  Infective otitis externa left  Presentation concerning for infection, cerumen impaction present to the right, prescribed ciprodex and discussed administration, recommenced supportive care with follow up for persisting symptoms.  Final Clinical Impressions(s) / UC Diagnoses   Final diagnoses:  None   Discharge Instructions   None    ED Prescriptions   None    PDMP not reviewed this encounter.   Teresa Shelba SAUNDERS, NP 05/01/24 1328

## 2024-05-02 ENCOUNTER — Other Ambulatory Visit: Payer: Self-pay

## 2024-05-02 ENCOUNTER — Emergency Department
Admission: EM | Admit: 2024-05-02 | Discharge: 2024-05-02 | Disposition: A | Discharge status: Home / Self Care | Attending: Emergency Medicine | Admitting: Emergency Medicine

## 2024-05-02 DIAGNOSIS — J028 Acute pharyngitis due to other specified organisms: Secondary | ICD-10-CM | POA: Insufficient documentation

## 2024-05-02 DIAGNOSIS — B9789 Other viral agents as the cause of diseases classified elsewhere: Secondary | ICD-10-CM | POA: Insufficient documentation

## 2024-05-02 DIAGNOSIS — J029 Acute pharyngitis, unspecified: Secondary | ICD-10-CM

## 2024-05-02 DIAGNOSIS — R0981 Nasal congestion: Secondary | ICD-10-CM | POA: Diagnosis present

## 2024-05-02 DIAGNOSIS — H6123 Impacted cerumen, bilateral: Secondary | ICD-10-CM | POA: Insufficient documentation

## 2024-05-02 LAB — GROUP A STREP BY PCR: Group A Strep by PCR: NOT DETECTED

## 2024-05-02 MED ORDER — CARBAMIDE PEROXIDE 6.5 % OT SOLN: 5.0000 [drp] | Freq: Two times a day (BID) | OTIC | 0 refills | Status: AC

## 2024-05-02 MED ORDER — DOCUSATE SODIUM 50 MG/5ML PO LIQD
50.0000 mg | Freq: Once | ORAL | Status: AC
  Administered 2024-05-02: 50 mg
  Filled 2024-05-02: qty 10

## 2024-05-02 MED ORDER — LIDOCAINE VISCOUS HCL 2 % MT SOLN: 15.0000 mL | Freq: Four times a day (QID) | OROMUCOSAL | 0 refills | Status: AC | PRN

## 2024-05-02 NOTE — ED Triage Notes (Signed)
 Pt says she feels like her ears have been clogged for 2 weeks. Pt went to urgent care and they gave her drops to use for 7 days. She woke up this morning and c/o sore throat.

## 2024-05-02 NOTE — ED Provider Notes (Signed)
 [facility] Provider Note    Event Date/Time   First MD Initiated Contact with Patient 05/02/24 1632     (approximate)   History   Ear Fullness and Sore Throat   HPI  Monique Vasquez is a 38 y.o. female  with no significant past medical history presents to the emergency department with bilateral ear fullness, left-sided that started 2 weeks ago and right sided that started yesterday with reports of not being able to hear out of both ears. Patient also reports nasal congestion, intermittent cough, sore throat and noticed a white patch on her right tonsil that she noticed this morning.  Denies fever.  Patient was recently seen at [facility] urgent care yesterday, given Ciprodex for infective left otitis externa.  Patient denies any ear pain, drainage, chest pain, shortness of breath, abdominal pain, nausea, vomiting, puslike discharge, drooling, fever, chills, neck pain.   Physical Exam   Triage Vital Signs: ED Triage Vitals  Encounter Vitals Group     BP 05/02/24 1524 (!) 150/108     Girls Systolic BP Percentile --      Girls Diastolic BP Percentile --      Boys Systolic BP Percentile --      Boys Diastolic BP Percentile --      Pulse Rate 05/02/24 1523 75     Resp 05/02/24 1523 17     Temp 05/02/24 1523 98.6 F (37 C)     Temp Source 05/02/24 1523 Oral     SpO2 05/02/24 1524 100 %     Weight 05/02/24 1523 246 lb (111.6 kg)     Height 05/02/24 1523 5' 4 (1.626 m)     Head Circumference --      Peak Flow --      Pain Score 05/02/24 1523 3     Pain Loc --      Pain Education --      Exclude from Growth Chart --     Most recent vital signs: Vitals:   05/02/24 1523 05/02/24 1524  BP:  (!) 150/108  Pulse: 75 76  Resp: 17 17  Temp: 98.6 F (37 C)   SpO2:  100%    General: Awake, in no acute distress.  Head: Normocephalic, atraumatic. Eyes: No scleral icterus or conjunctival injection. Ears/Nose/Throat: TMs intact b/l, b/l ears with  Liggins-yellow cerumen, left worse than right. No active ear drainage. No postauricular swelling or skin changes. Nares patent, no nasal discharge. Oropharynx moist and erythematous, w/ bilateral white exudates. Dentition intact. No trismus or drooling. Neck: Supple, no lymphadenopathy, no nuchal rigidity. CV: Good peripheral perfusion. RRR, 76 bpm. Respiratory:Normal respiratory effort.  No respiratory distress. CTAB. GI: Soft, non-distended. Skin:Warm, dry, intact. No rashes, lesions, or ecchymosis.   ED Results / Procedures / Treatments   Labs (all labs ordered are listed, but only abnormal results are displayed) Labs Reviewed  GROUP A STREP BY PCR     EKG     RADIOLOGY    PROCEDURES:  Critical Care performed: No   Ear Cerumen Removal  Date/Time: 05/02/2024 6:57 PM  Performed by: Sheron Salm, PA-C Authorized by: Sheron Salm, PA-C   Consent:    Consent obtained:  Verbal   Consent given by:  Patient   Risks, benefits, and alternatives were discussed: yes     Risks discussed:  Bleeding, dizziness, incomplete removal, infection, pain and TM perforation Universal protocol:    Procedure explained and questions answered to patient or proxy's satisfaction:  yes     Immediately prior to procedure, a time out was called: yes     Patient identity confirmed:  Verbally with patient Procedure details:    Location:  R ear and L ear   Procedure type: irrigation     Procedure type comment:  Curette and irrigation   Procedure outcomes: cerumen removed   Post-procedure details:    Inspection:  Some cerumen remaining, no bleeding and TM intact   Hearing quality:  Improved   Procedure completion:  Tolerated well, no immediate complications    MEDICATIONS ORDERED IN ED: Medications  docusate (COLACE) 50 MG/5ML liquid 50 mg (50 mg Per Tube Given 05/02/24 1704)     IMPRESSION / MDM / ASSESSMENT AND PLAN / ED COURSE  I reviewed the triage vital signs and the nursing  notes.                              Differential diagnosis includes, but is not limited to, mononucleosis/viral pharyngitis, strep pharyngitis, peritonsillar abscess, cerumen impaction, perforated tympanic membrane, otitis media, otitis externa  Patient's presentation is most consistent with acute complicated illness / injury requiring diagnostic workup.  Patient is a 38 year old female here with signs and symptoms as described above.  She is afebrile and well-appearing.  No trismus.  No drooling or neck pain.  Ears with bilateral cerumen, no evidence of perforated eardrum.  Please see procedure note for ear cerumen removal of bilateral ears with liquid Colace followed by half warm water/half hydrogen peroxide.  Patient also with sore throat.  Strep PCR is negative.  She does have bilateral exudates on exam.  Believe this may be likely mono, did not order lab test because it does not change the treatment plan.  Discussed supportive care with her and refraining from contact sports at this time.  Told her to continue using her Ciprodex prescribed and sent her home with Debrox prescription and lidocaine  mouth solution to help with any pain.  Discussed return precautions with her.  Will have her follow-up with ENT outpatient for removal of remaining wax.  The patient may return to the emergency department for any new, worsening, or concerning symptoms. Patient was given the opportunity to ask questions; all questions were answered. Emergency department return precautions were discussed with the patient.  Patient is in agreement to the treatment plan.  Patient is stable for discharge.   FINAL CLINICAL IMPRESSION(S) / ED DIAGNOSES   Final diagnoses:  Bilateral impacted cerumen  Viral pharyngitis     Rx / DC Orders   ED Discharge Orders          Ordered    lidocaine  (XYLOCAINE ) 2 % solution  Every 6 hours PRN        05/02/24 1822    carbamide peroxide (DEBROX) 6.5 % OTIC solution  2 times daily         05/02/24 1822             Note:  This document was prepared using Dragon voice recognition software and may include unintentional dictation errors.     Sheron [place], PA-C 05/02/24 1900    Bradler, Evan K, MD 05/02/24 [phone]

## 2024-05-02 NOTE — Discharge Instructions (Addendum)
 You were seen in the emergency department for earwax in both ears.  Please continue taking your Ciprodex at home prescribed by your provider.  I will also send in some eardrops to help soften the wax so when you follow-up with ear nose throat (ENT, Dr. , it will be easier for them to remove.   You were also seen for a viral sore throat infection.  You may use cold liquids, popsicles, honey and tea at home to help with your pain.  Will also send you with some viscous lidocaine  to swish in your mouth to help with pain.  To the emergency department if you experience high fever that is not improving with ibuprofen  and Tylenol  use at home, puslike discharge from your mouth, worsening mouth pain, inability to open your jaw, nausea, vomiting, drooling, neck pain, chest pain, trouble breathing, or any other new, worsening or concerning symptoms.  You may return to work once 24 hours without a fever. Please drink plenty of clear fluids (water, Gatorade, chicken broth, etc). You can use hard candy or sore throat lozenges to help with any throat pain. You may use Tylenol  and/or Motrin  according to over-the-counter bottle instructions for fever.  You can alternate between the two without any side effects. Please change your toothbrush in 3 days.   Please do not engage in contact sports for the next 4-6 weeks, as this may be a virus that impacts your spleen and cause a rupture if you are hit in your spleen.
